# Patient Record
Sex: Female | Born: 1995
Health system: Southern US, Community
[De-identification: ages and names within clinical notes are randomized; demographics above are authoritative.]

## PROBLEM LIST (undated history)

## (undated) DIAGNOSIS — J45909 Unspecified asthma, uncomplicated: Secondary | ICD-10-CM

## (undated) HISTORY — PX: CARDIAC SURGERY: SHX584

---
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
## 2018-01-30 IMAGING — US US ABDOMEN COMPLETE
1 series · 14 of 25 positions shown · non-contrast
Comparison: None.

CLINICAL DATA: Acute onset of left flank pain and upper abdominal
pain. Initial encounter.

EXAM:
ABDOMEN ULTRASOUND COMPLETE

[Series 1: us abdomen complete · 0.23mm/px · 14 of 87 slices shown]
[im 1/87]
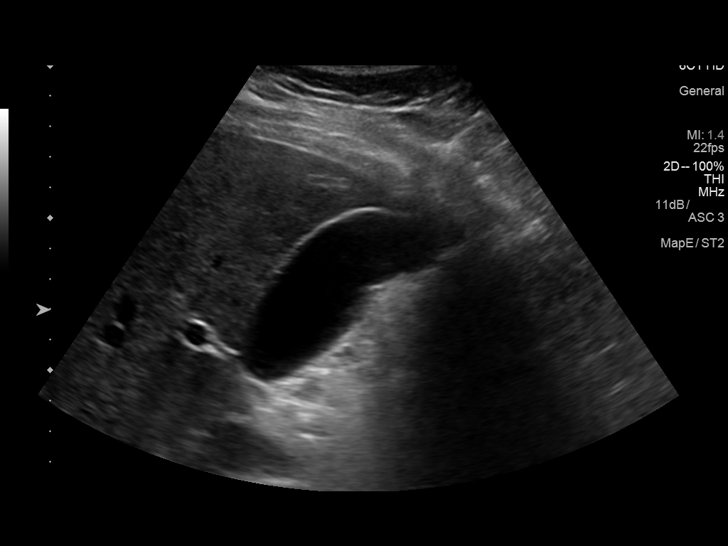
[im 8/87]
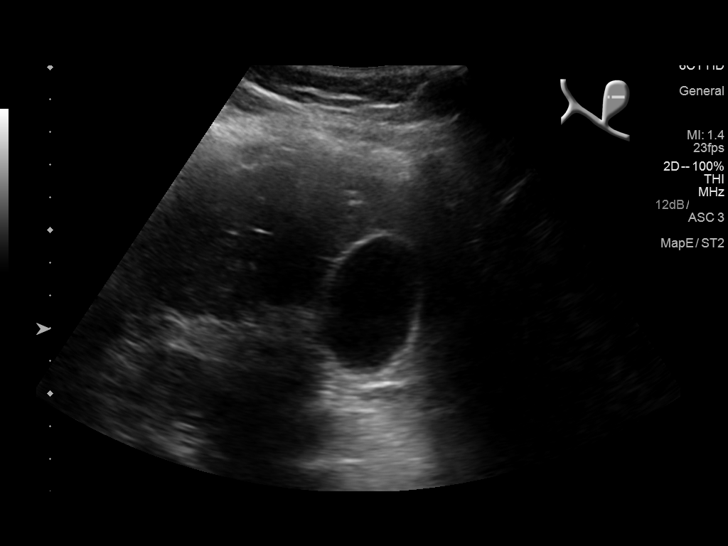
[im 15/87]
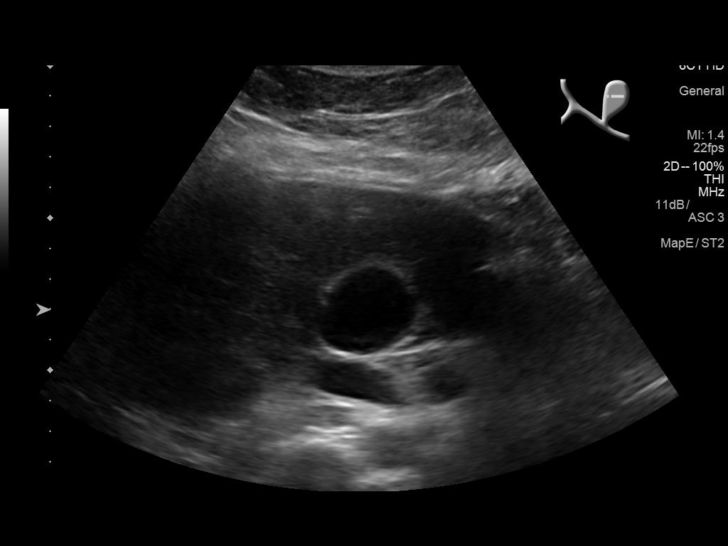
[im 22/87]
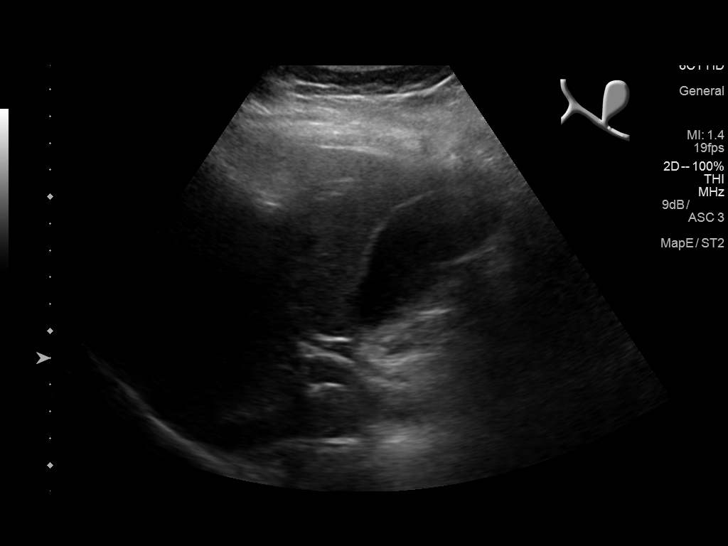
[im 29/87]
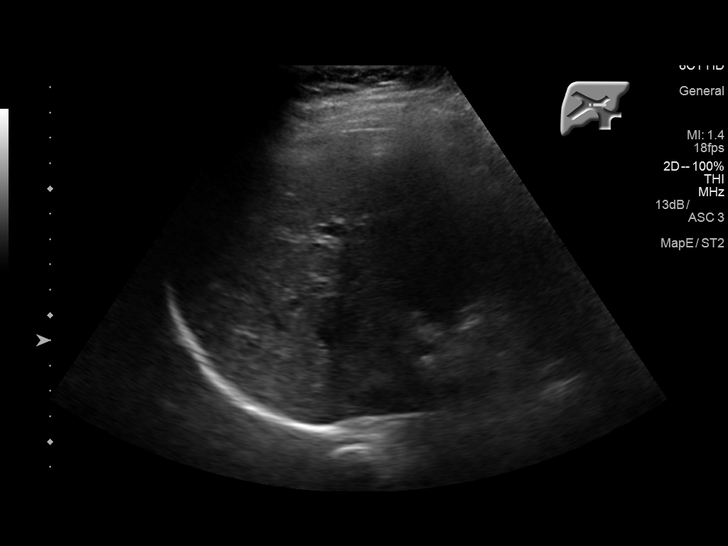
[im 33/87]
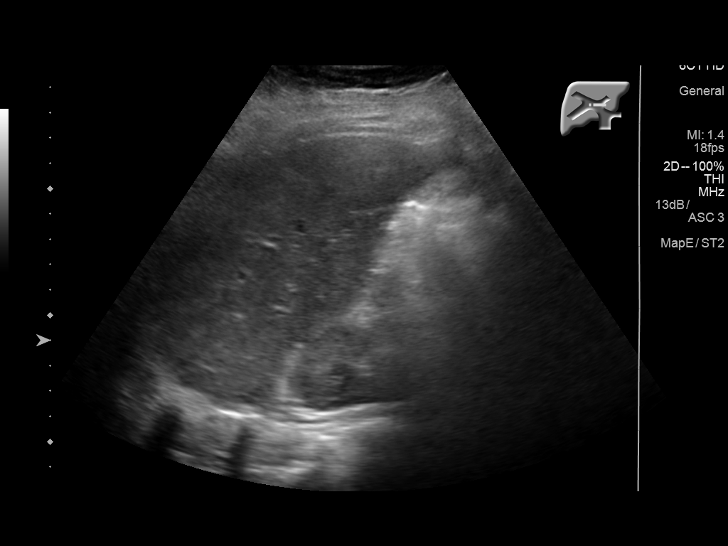
[im 40/87]
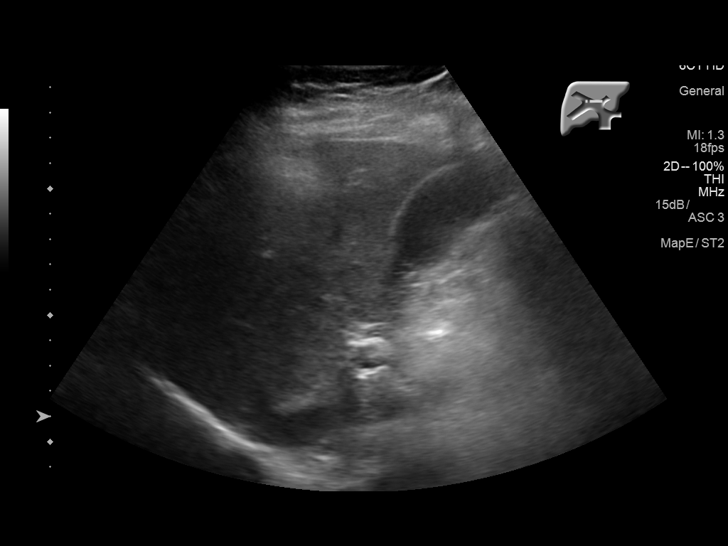
[im 47/87]
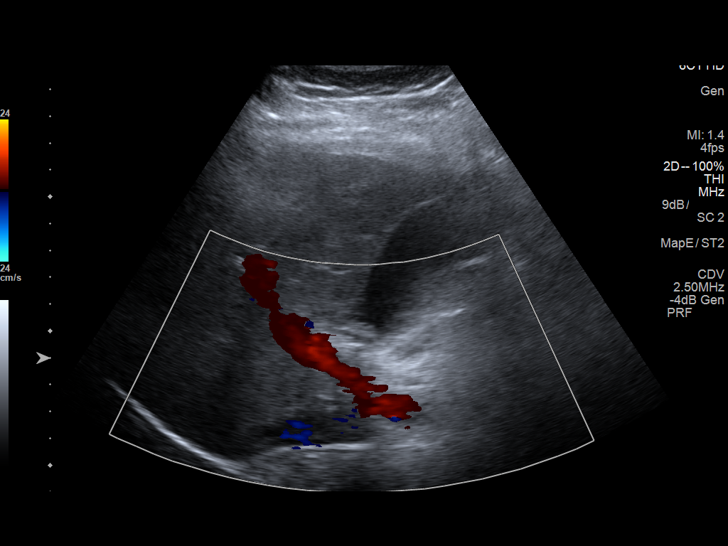
[im 54/87]
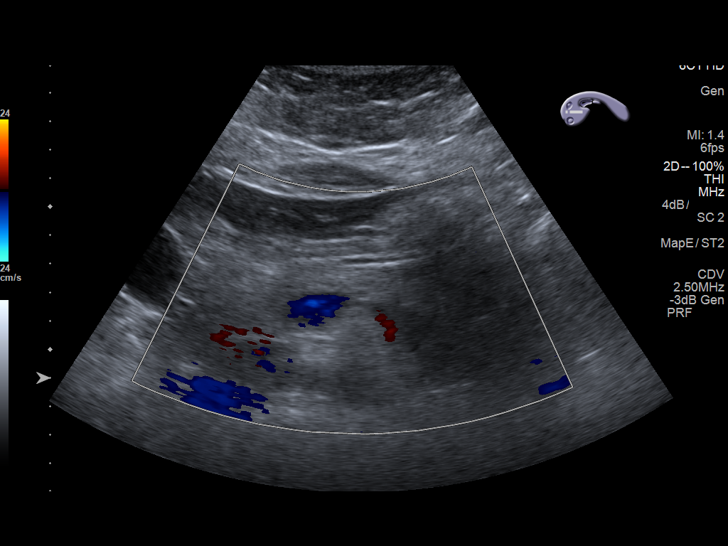
[im 58/87]
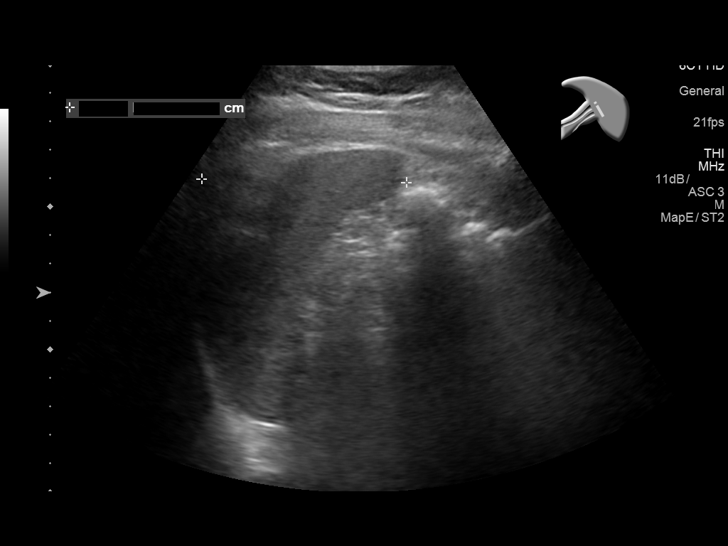
[im 65/87]
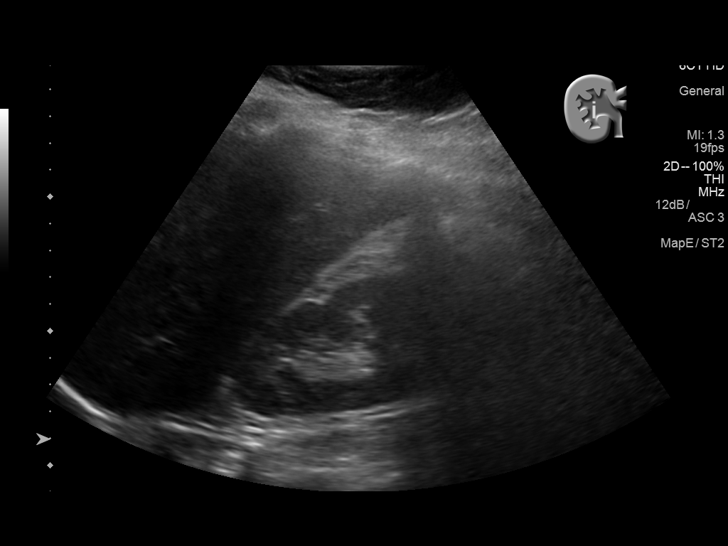
[im 72/87]
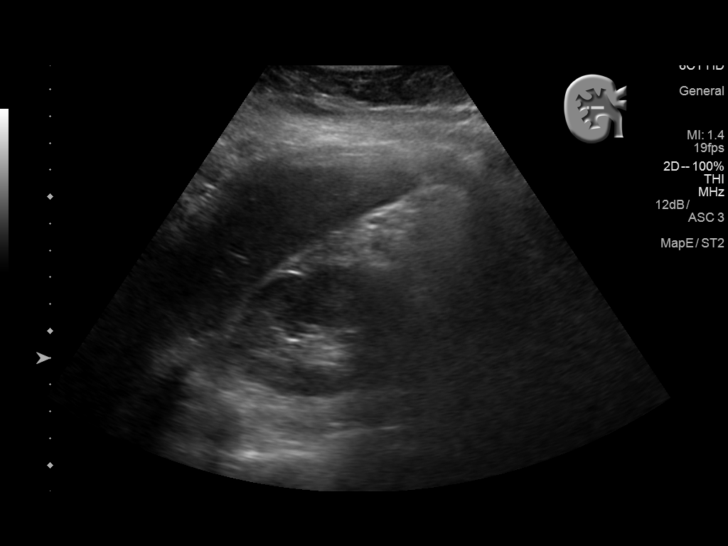
[im 79/87]
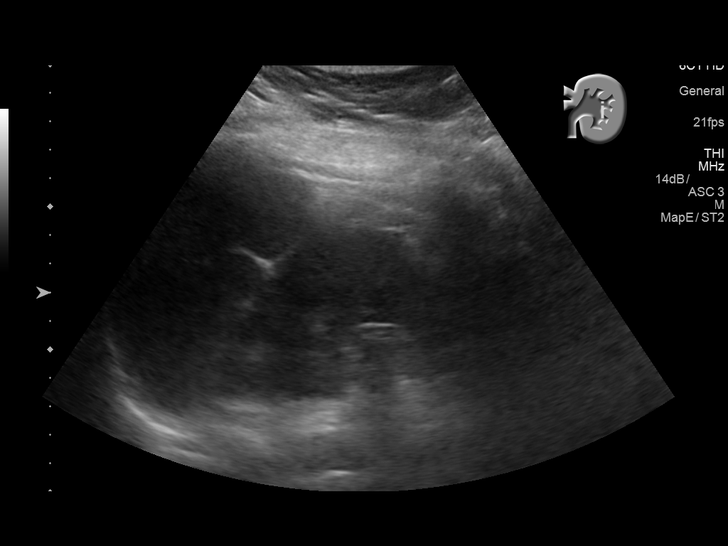
[im 87/87]
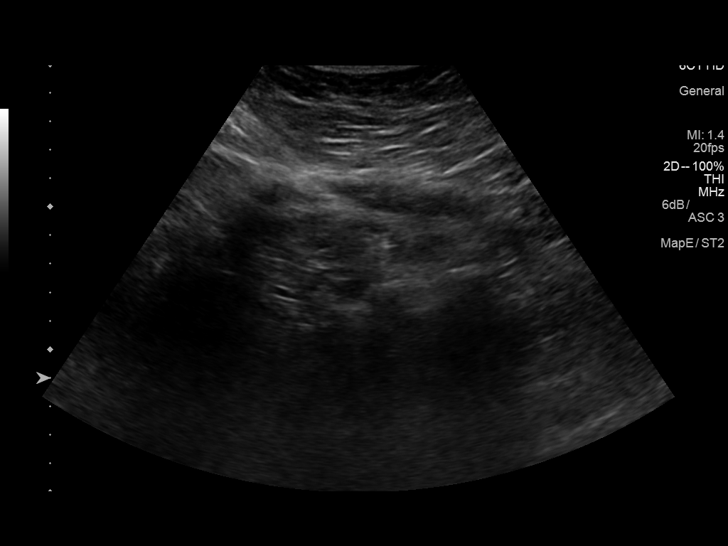

[14 of 25 positions shown; findings below may reference images not displayed]

FINDINGS: Gallbladder: No gallstones or wall thickening visualized. No
sonographic Murphy sign noted by sonographer.

Common bile duct: Diameter: 0.5 cm, within normal limits in caliber.

Liver: No focal lesion identified. Within normal limits in
parenchymal echogenicity.

IVC: No abnormality visualized.

Pancreas: Visualized portion unremarkable.

Spleen: Size and appearance within normal limits.

Right Kidney: Length: 10.5 cm. Echogenicity within normal limits. No
mass or hydronephrosis visualized.

Left Kidney: Length: 11.0 cm. Echogenicity within normal limits. No
mass or hydronephrosis visualized.

Abdominal aorta: No aneurysm visualized.

Other findings: None.
IMPRESSION: Unremarkable abdominal ultrasound.

## 2018-09-12 IMAGING — CR DG FOREARM 2V*L*
2 series · 2 of 2 positions shown · non-contrast
Comparison: None.

CLINICAL DATA: Broken glass with laceration. Concern for foreign
body.

EXAM:
LEFT FOREARM - 2 VIEW

[x forearm ap left]
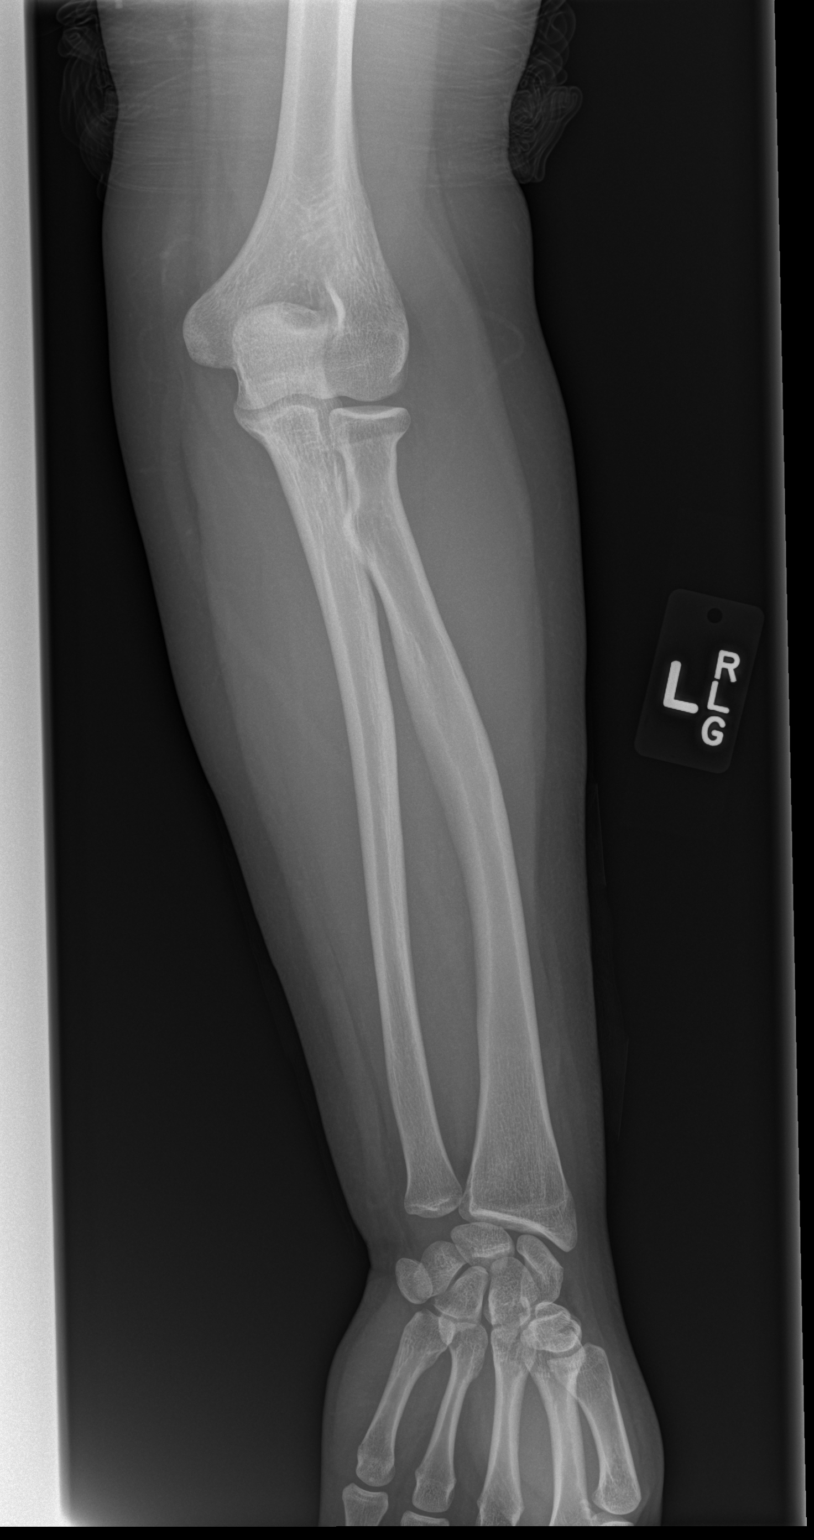

[x forearm lat left]
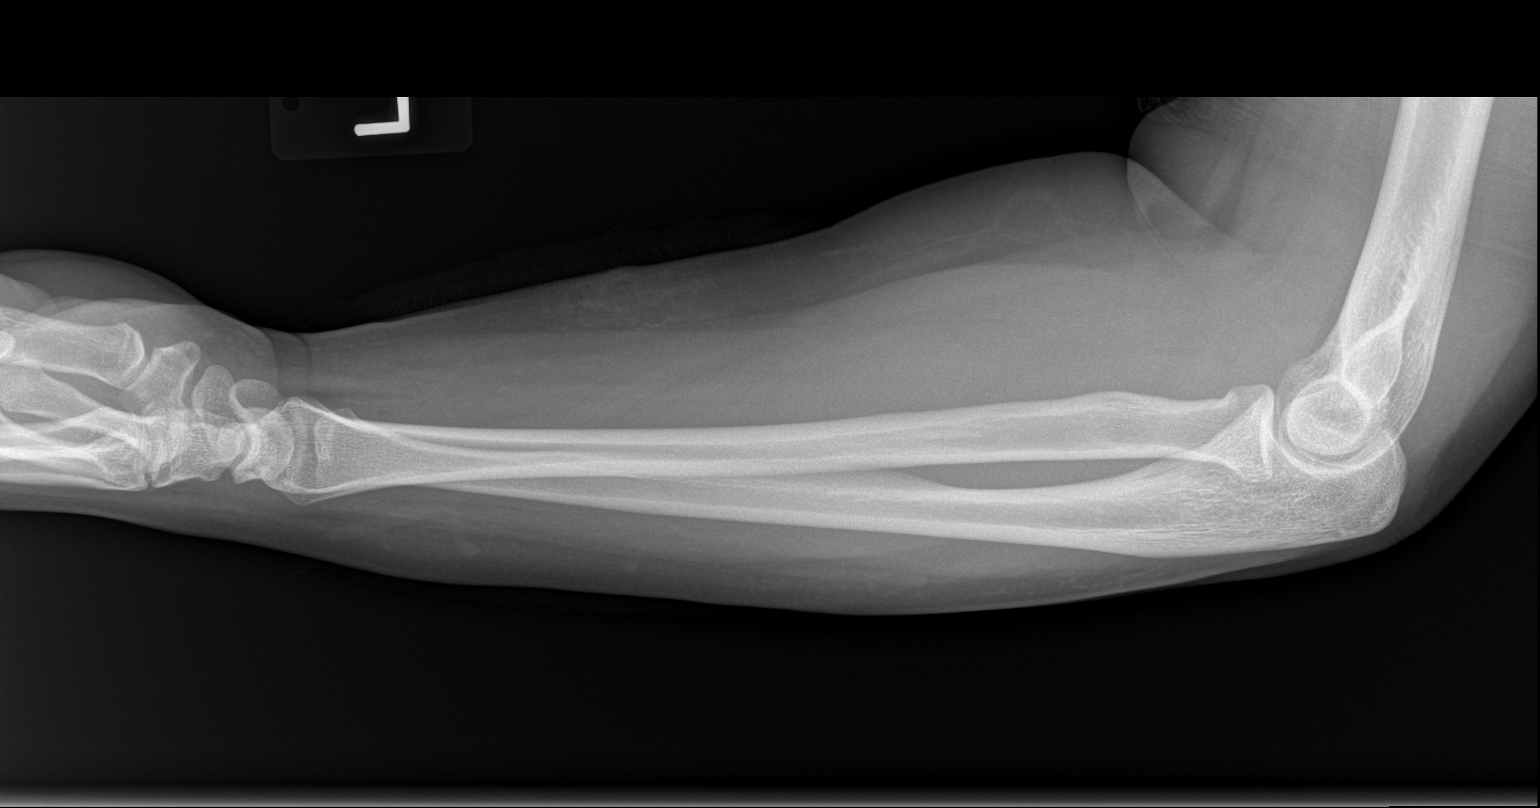

[2 of 2 positions shown; findings below may reference images not displayed]

FINDINGS: There is no evidence of fracture or other focal bone lesions. Skin
irregularity about the volar mid distal forearm. No radiopaque
foreign body.
IMPRESSION: Skin irregularity of the mid distal volar forearm suggesting
laceration. No radiopaque foreign body or acute osseous abnormality.
